# Patient Record
Sex: Male | Born: 1990 | Race: White | Hispanic: No | Marital: Single | Smoking: Current every day smoker
Health system: Southern US, Community
[De-identification: ages and names within clinical notes are randomized; demographics above are authoritative.]

## PROBLEM LIST (undated history)

## (undated) DIAGNOSIS — F1010 Alcohol abuse, uncomplicated: Secondary | ICD-10-CM

## (undated) DIAGNOSIS — R569 Unspecified convulsions: Secondary | ICD-10-CM

## (undated) DIAGNOSIS — F259 Schizoaffective disorder, unspecified: Secondary | ICD-10-CM

## (undated) DIAGNOSIS — K7400 Hepatic fibrosis, unspecified: Secondary | ICD-10-CM

## (undated) DIAGNOSIS — A4902 Methicillin resistant Staphylococcus aureus infection, unspecified site: Secondary | ICD-10-CM

## (undated) DIAGNOSIS — B182 Chronic viral hepatitis C: Secondary | ICD-10-CM

## (undated) DIAGNOSIS — F250 Schizoaffective disorder, bipolar type: Secondary | ICD-10-CM

## (undated) HISTORY — PX: ABDOMINAL SURGERY: SHX537

---
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
## 2012-11-24 ENCOUNTER — Encounter (HOSPITAL_BASED_OUTPATIENT_CLINIC_OR_DEPARTMENT_OTHER): Payer: Self-pay | Admitting: *Deleted

## 2012-11-24 ENCOUNTER — Emergency Department (HOSPITAL_BASED_OUTPATIENT_CLINIC_OR_DEPARTMENT_OTHER)
Admission: EM | Admit: 2012-11-24 | Discharge: 2012-11-24 | Disposition: A | Discharge status: Rehabilitation Facility | Payer: Self-pay | Attending: Emergency Medicine | Admitting: Emergency Medicine

## 2012-11-24 DIAGNOSIS — R112 Nausea with vomiting, unspecified: Secondary | ICD-10-CM | POA: Insufficient documentation

## 2012-11-24 DIAGNOSIS — M2550 Pain in unspecified joint: Secondary | ICD-10-CM | POA: Insufficient documentation

## 2012-11-24 DIAGNOSIS — R5381 Other malaise: Secondary | ICD-10-CM | POA: Insufficient documentation

## 2012-11-24 DIAGNOSIS — R509 Fever, unspecified: Secondary | ICD-10-CM | POA: Insufficient documentation

## 2012-11-24 DIAGNOSIS — F17200 Nicotine dependence, unspecified, uncomplicated: Secondary | ICD-10-CM | POA: Insufficient documentation

## 2012-11-24 HISTORY — DX: Hepatic fibrosis, unspecified: K74.00

## 2012-11-24 HISTORY — DX: Alcohol abuse, uncomplicated: F10.10

## 2012-11-24 HISTORY — DX: Hepatic fibrosis: K74.0

## 2012-11-24 HISTORY — DX: Methicillin resistant Staphylococcus aureus infection, unspecified site: A49.02

## 2012-11-24 LAB — URINALYSIS, ROUTINE W REFLEX MICROSCOPIC
Nitrite: NEGATIVE
Specific Gravity, Urine: 1.005 (ref 1.005–1.030)
Urobilinogen, UA: 0.2 mg/dL (ref 0.0–1.0)

## 2012-11-24 LAB — CBC WITH DIFFERENTIAL/PLATELET
Lymphocytes Relative: 32 % (ref 12–46)
Lymphs Abs: 1.6 10*3/uL (ref 0.7–4.0)
MCV: 91.9 fL (ref 78.0–100.0)
Neutrophils Relative %: 47 % (ref 43–77)
Platelets: 234 10*3/uL (ref 150–400)
RBC: 4.58 MIL/uL (ref 4.22–5.81)
WBC: 5 10*3/uL (ref 4.0–10.5)

## 2012-11-24 LAB — COMPREHENSIVE METABOLIC PANEL
Albumin: 3.9 g/dL (ref 3.5–5.2)
BUN: 11 mg/dL (ref 6–23)
Chloride: 100 mEq/L (ref 96–112)
Creatinine, Ser: 0.8 mg/dL (ref 0.50–1.35)
GFR calc non Af Amer: >90 mL/min (ref >90)
Total Bilirubin: 0.5 mg/dL (ref 0.3–1.2)

## 2012-11-24 LAB — PROTIME-INR
INR: 1.11 (ref 0.00–1.49)
Prothrombin Time: 14.1 seconds (ref 11.6–15.2)

## 2012-11-24 MED ORDER — PROCHLORPERAZINE MALEATE 10 MG PO TABS: 5.0000 mg | ORAL_TABLET | Freq: Four times a day (QID) | ORAL | Status: AC | PRN

## 2012-11-24 MED ORDER — ONDANSETRON 8 MG PO TBDP
8.0000 mg | ORAL_TABLET | Freq: Once | ORAL | Status: AC
  Administered 2012-11-24: 8 mg via ORAL
  Filled 2012-11-24: qty 1

## 2012-11-24 NOTE — ED Notes (Signed)
Pt amb to room 5 with quick steady gait in nad. Pt reports feeling "sick on my stomach" since 8/13. 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 Pt reports no appetite for several days. Has not seen his liver doctor "in over a year.Marland KitchenMarland Kitchen"

## 2012-11-24 NOTE — ED Provider Notes (Signed)
CSN: [id number]     Arrival date & time 11/24/12  0813 History     First MD Initiated Contact with Patient 11/24/12 [phone]     Chief Complaint  Patient presents with  . Nausea   (Consider location/radiation/quality/duration/timing/severity/associated sxs/prior Treatment) HPI Complains of nausea and vomiting for the past 11 days. Also complains of diffuse arthralgias for 11 days. Symptoms accompanied by subjective fever. No abdominal pain however feels "sick on my stomach". Last bowel movement yesterday, normal last time he vomited was yesterday. He drinks 1 gallon of water per day without vomiting. However, all foods and drinking water make him nauseated. Nothing makes symptoms better or worse. No treatment prior to coming here. No other associated symptoms No past medical history on file. No past surgical history on file. No family history on file. History  Substance Use Topics  . Smoking status: Not on file  . Smokeless tobacco: Not on file  . Alcohol Use: Not on file   social history positive smoker former abuser of alcohol, clean for 80 days. 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 Currently resides at [facility] past medical history hepatitis C, mononucleosis, seizure disorder.  Surgical history abdominal surgery for MRSA several years ago  Review of Systems  Constitutional: Positive for fatigue.  HENT: Negative.   Respiratory: Negative.   Cardiovascular: Negative.   Gastrointestinal: Positive for nausea and vomiting.  Musculoskeletal: Positive for arthralgias.  Skin: Negative.   Neurological: Negative.   Psychiatric/Behavioral: Negative.   All other systems reviewed and are negative.    Allergies  Review of patient's allergies indicates not on file.  Home Medications  No current outpatient prescriptions on file. BP 113/67  Pulse 90  Temp(Src) 98.2 F (36.8 C) (Oral)  Resp 20  Ht 5\' 11"  (1.803 m)  Wt 190 lb (86.183 kg)  BMI 26.51 kg/m2  SpO2 99% Physical  Exam  Nursing note and vitals reviewed. Constitutional: He appears well-developed and well-nourished. No distress.  Not acutely ill appearing  HENT:  Head: Normocephalic and atraumatic.  Eyes: Conjunctivae are normal. Pupils are equal, round, and reactive to light.  Neck: Neck supple. No tracheal deviation present. No thyromegaly present.  Cardiovascular: Normal rate and regular rhythm.   No murmur heard. Pulmonary/Chest: Effort normal and breath sounds normal.  Abdominal: Soft. Bowel sounds are normal. He exhibits no distension. There is no tenderness.  Genitourinary:  Normal circumcised male  Musculoskeletal: Normal range of motion. He exhibits no edema and no tenderness.  Neurological: He is alert. Coordination normal.  Skin: Skin is warm and dry. No rash noted.  Psychiatric: He has a normal mood and affect.    ED Course   Procedures (including critical care time)  Labs Reviewed - No data to display No results found. No diagnosis found. 9:30 AM nausea is resolved after treatment with Zofran. Results for orders placed during the hospital encounter of 11/24/12  COMPREHENSIVE METABOLIC PANEL      Result Value Range   Sodium 135  135 - 145 mEq/L   Potassium 3.9  3.5 - 5.1 mEq/L   Chloride 100  96 - 112 mEq/L   CO2 27  19 - 32 mEq/L   Glucose, Bld 82  70 - 99 mg/dL   BUN 11  6 - 23 mg/dL   Creatinine, Ser 1.19  0.50 - 1.35 mg/dL   Calcium 9.9  8.4 - 14.7 mg/dL   Total Protein 7.0  6.0 - 8.3 g/dL   Albumin 3.9  3.5 - 5.2 g/dL   AST 57 (*) 0 - 37 U/L   ALT 135 (*) 0 - 53 U/L   Alkaline Phosphatase 58  39 - 117 U/L   Total Bilirubin 0.5  0.3 - 1.2 mg/dL   GFR calc non Af Amer >90  >90 mL/min   GFR calc Af Amer >90  >90 mL/min  CBC WITH DIFFERENTIAL      Result Value Range   WBC 5.0  4.0 - 10.5 K/uL   RBC 4.58  4.22 - 5.81 MIL/uL   Hemoglobin 14.6  13.0 - 17.0 g/dL   HCT 16.1  09.6 - 04.5 %   MCV 91.9  78.0 - 100.0 fL   MCH 31.9  26.0 - 34.0 pg   MCHC 34.7  30.0 -  36.0 g/dL   RDW 40.9  81.1 - 91.4 %   Platelets 234  150 - 400 K/uL   Neutrophils Relative % 47  43 - 77 %   Neutro Abs 2.3  1.7 - 7.7 K/uL   Lymphocytes Relative 32  12 - 46 %   Lymphs Abs 1.6  0.7 - 4.0 K/uL   Monocytes Relative 15 (*) 3 - 12 %   Monocytes Absolute 0.7  0.1 - 1.0 K/uL   Eosinophils Relative 6 (*) 0 - 5 %   Eosinophils Absolute 0.3  0.0 - 0.7 K/uL   Basophils Relative 1  0 - 1 %   Basophils Absolute 0.0  0.0 - 0.1 K/uL  PROTIME-INR      Result Value Range   Prothrombin Time 14.1  11.6 - 15.2 seconds   INR 1.11  0.00 - 1.49  URINALYSIS, ROUTINE W REFLEX MICROSCOPIC      Result Value Range   Color, Urine YELLOW  YELLOW   APPearance CLEAR  CLEAR   Specific Gravity, Urine 1.005  1.005 - 1.030   pH 6.5  5.0 - 8.0   Glucose, UA NEGATIVE  NEGATIVE mg/dL   Hgb urine dipstick NEGATIVE  NEGATIVE   Bilirubin Urine NEGATIVE  NEGATIVE   Ketones, ur NEGATIVE  NEGATIVE mg/dL   Protein, ur NEGATIVE  NEGATIVE mg/dL   Urobilinogen, UA 0.2  0.0 - 1.0 mg/dL   Nitrite NEGATIVE  NEGATIVE   Leukocytes, UA NEGATIVE  NEGATIVE   No results found.  MDM  Patient reports that his joint aches started after his Neurontin was stopped by his psychiatrist. I explained that he should contact his psychiatrist regarding his symptoms and discuss Neurontin  with her, and that's benefit him in the past. I explained uncomfortable adjusting his psychiatric medications. He is in agreement. I discussed lab work with patient. He should also contact his primary care physician if not feeling better by next week Plan prescription Compazine, and Compazine as well as metabolized by the liver. Diagnosis #1 nausea and vomiting #2 arthralgias   Doug Sou, MD 11/24/12 [phone]

## 2013-01-12 ENCOUNTER — Emergency Department (HOSPITAL_BASED_OUTPATIENT_CLINIC_OR_DEPARTMENT_OTHER)
Admission: EM | Admit: 2013-01-12 | Discharge: 2013-01-12 | Disposition: A | Discharge status: Home / Self Care | Payer: Self-pay | Attending: Emergency Medicine | Admitting: Emergency Medicine

## 2013-01-12 ENCOUNTER — Encounter (HOSPITAL_BASED_OUTPATIENT_CLINIC_OR_DEPARTMENT_OTHER): Payer: Self-pay | Admitting: Emergency Medicine

## 2013-01-12 ENCOUNTER — Emergency Department (HOSPITAL_BASED_OUTPATIENT_CLINIC_OR_DEPARTMENT_OTHER): Payer: Self-pay

## 2013-01-12 DIAGNOSIS — Z79899 Other long term (current) drug therapy: Secondary | ICD-10-CM | POA: Insufficient documentation

## 2013-01-12 DIAGNOSIS — J069 Acute upper respiratory infection, unspecified: Secondary | ICD-10-CM | POA: Insufficient documentation

## 2013-01-12 DIAGNOSIS — F1021 Alcohol dependence, in remission: Secondary | ICD-10-CM | POA: Insufficient documentation

## 2013-01-12 DIAGNOSIS — Z8614 Personal history of Methicillin resistant Staphylococcus aureus infection: Secondary | ICD-10-CM | POA: Insufficient documentation

## 2013-01-12 DIAGNOSIS — Z8619 Personal history of other infectious and parasitic diseases: Secondary | ICD-10-CM | POA: Insufficient documentation

## 2013-01-12 DIAGNOSIS — G40909 Epilepsy, unspecified, not intractable, without status epilepticus: Secondary | ICD-10-CM | POA: Insufficient documentation

## 2013-01-12 DIAGNOSIS — H9209 Otalgia, unspecified ear: Secondary | ICD-10-CM | POA: Insufficient documentation

## 2013-01-12 DIAGNOSIS — F17200 Nicotine dependence, unspecified, uncomplicated: Secondary | ICD-10-CM | POA: Insufficient documentation

## 2013-01-12 HISTORY — DX: Unspecified convulsions: R56.9

## 2013-01-12 HISTORY — DX: Chronic viral hepatitis C: B18.2

## 2013-01-12 MED ORDER — HYDROCODONE-HOMATROPINE 5-1.5 MG/5ML PO SYRP
5.0000 mL | ORAL_SOLUTION | Freq: Once | ORAL | Status: DC
  Filled 2013-01-12: qty 5

## 2013-01-12 MED ORDER — DEXAMETHASONE SODIUM PHOSPHATE 10 MG/ML IJ SOLN
10.0000 mg | Freq: Once | INTRAMUSCULAR | Status: AC
  Administered 2013-01-12: 10 mg via INTRAMUSCULAR
  Filled 2013-01-12: qty 1

## 2013-01-12 MED ORDER — ALBUTEROL SULFATE HFA 108 (90 BASE) MCG/ACT IN AERS
2.0000 | INHALATION_SPRAY | Freq: Once | RESPIRATORY_TRACT | Status: AC
  Administered 2013-01-12: 2 via RESPIRATORY_TRACT
  Filled 2013-01-12: qty 6.7

## 2013-01-12 MED ORDER — HYDROCODONE-ACETAMINOPHEN 7.5-325 MG/15ML PO SOLN: 15.0000 mL | Freq: Three times a day (TID) | ORAL | Status: AC | PRN

## 2013-01-12 MED ORDER — HYDROCODONE-ACETAMINOPHEN 7.5-325 MG/15ML PO SOLN
10.0000 mL | Freq: Once | ORAL | Status: AC
  Administered 2013-01-12: 15 mL via ORAL
  Filled 2013-01-12: qty 15

## 2013-01-12 MED ORDER — ALBUTEROL SULFATE HFA 108 (90 BASE) MCG/ACT IN AERS: 2.0000 | INHALATION_SPRAY | RESPIRATORY_TRACT | Status: AC | PRN

## 2013-01-12 NOTE — ED Provider Notes (Signed)
CSN: [id number]     Arrival date & time 01/12/13  1124 History   First MD Initiated Contact with Patient 01/12/13 1235     Chief Complaint  Patient presents with  . Cough   (Consider location/radiation/quality/duration/timing/severity/associated sxs/prior Treatment) HPI Comments: Patient is a 22 year old male past medical history significant for ETOH abuse, Hepatitis C presenting to the emergency department for 2 weeks of a worsening nonproductive cough. Patient states his cough is worse at night or with lying down. States he has tried over-the-counter cough medications without relief. States he is having associated ear pain without discharge as well as a sore throat and chest tightness with coughing spells. He denies any fevers, rhinorrhea, nausea, vomiting, abdominal pain, headaches.    Past Medical History  Diagnosis Date  . Liver fibrosis   . Alcohol abuse   . MRSA infection   . Hep C w/o coma, chronic   . Seizures    Past Surgical History  Procedure Laterality Date  . Abdominal surgery     No family history on file. History  Substance Use Topics  . Smoking status: Current Every Day Smoker -- 0.50 packs/day    Types: Cigarettes  . Smokeless tobacco: Never Used  . Alcohol Use: No    Review of Systems  Constitutional: Negative for fever and chills.  HENT: Positive for ear pain and sore throat. Negative for rhinorrhea, sinus pressure, trouble swallowing and voice change.   Respiratory: Positive for cough and chest tightness. Negative for wheezing and stridor.   Cardiovascular: Negative for chest pain.  Gastrointestinal: Negative for nausea, vomiting and abdominal pain.  Neurological: Negative for headaches.  All other systems reviewed and are negative.    Allergies  Review of patient's allergies indicates no known allergies.  Home Medications   Current Outpatient Rx  Name  Route  Sig  Dispense  Refill  . albuterol (PROVENTIL HFA;VENTOLIN HFA) 108 (90 BASE) MCG/ACT  inhaler   Inhalation   Inhale 2 puffs into the lungs every 4 (four) hours as needed for wheezing or shortness of breath.   1 Inhaler   1   . HYDROcodone-acetaminophen (HYCET) 7.5-325 mg/15 ml solution   Oral   Take 15 mLs by mouth every 8 (eight) hours as needed for cough.   120 mL   0   . lamoTRIgine (LAMICTAL) 100 MG tablet   Oral   Take 100 mg by mouth daily.         . OXcarbazepine (TRILEPTAL) 150 MG tablet   Oral   Take 150 mg by mouth 2 (two) times daily.         . prochlorperazine (COMPAZINE) 10 MG tablet   Oral   Take 0.5 tablets (5 mg total) by mouth every 6 (six) hours as needed (nausea).   10 tablet   0    BP 110/67  Pulse 85  Temp(Src) 98.8 F (37.1 C) (Oral)  Resp 16  Ht 5\' 11"  (1.803 m)  Wt 185 lb (83.915 kg)  BMI 25.81 kg/m2  SpO2 100% Physical Exam  Constitutional: He is oriented to person, place, and time. He appears well-developed and well-nourished.  HENT:  Head: Normocephalic and atraumatic.  Right Ear: Tympanic membrane, external ear and ear canal normal.  Left Ear: Tympanic membrane, external ear and ear canal normal.  Nose: Nose normal.  Mouth/Throat: Uvula is midline, oropharynx is clear and moist and mucous membranes are normal. No oropharyngeal exudate.  Eyes: Conjunctivae are normal.  Neck: Normal range of motion.  Neck supple.  Cardiovascular: Normal rate, regular rhythm, normal heart sounds and intact distal pulses.   Pulmonary/Chest: Effort normal and breath sounds normal. He exhibits no tenderness.  Dry cough appreciated during examination.  Abdominal: Soft. Bowel sounds are normal. There is no tenderness.  Musculoskeletal: Normal range of motion.  Lymphadenopathy:    He has no cervical adenopathy.  Neurological: He is alert and oriented to person, place, and time.  Skin: Skin is warm and dry.    ED Course  Procedures (including critical care time) Labs Review Labs Reviewed - No data to display Imaging Review Dg Chest 2  View  01/12/2013   CLINICAL DATA:  Cough.  EXAM: CHEST  2 VIEW  COMPARISON:  None.  FINDINGS: The cardiomediastinal silhouette is normal. No focal infiltrate or edema. No pleural effusion or pneumothorax. No acute osseous abnormality.  IMPRESSION: No active cardiopulmonary disease.   Electronically Signed   By: Jerene Dilling M.D.   On: 01/12/2013 13:27    EKG Interpretation   None       MDM   1. URI (upper respiratory infection)     Afebrile, NAD, non-toxic appearing, AAOx4. Pt CXR negative for acute infiltrate. Patients symptoms are consistent with URI, likely viral etiology. Discussed that antibiotics are not indicated for viral infections. Pt will be discharged with symptomatic treatment. Return precautions discussed. Verbalizes understanding and is agreeable with plan. Pt is hemodynamically stable & in NAD prior to dc.      Jeannetta Ellis, PA-C 01/12/13 1636

## 2013-01-12 NOTE — ED Notes (Signed)
Patient states he has a one month history of coughing and cold symptoms.  States his cough has persisted and intermittently is productive.  States he feels sob when cough.  Denies fever.

## 2013-01-16 NOTE — ED Provider Notes (Signed)
History/physical exam/procedure(s) were performed by non-physician practitioner and as supervising physician I was immediately available for consultation/collaboration. I have reviewed all notes and am in agreement with care and plan.   Hilario Quarry, MD 01/16/13 1901

## 2015-04-25 ENCOUNTER — Emergency Department (HOSPITAL_BASED_OUTPATIENT_CLINIC_OR_DEPARTMENT_OTHER)

## 2015-04-25 ENCOUNTER — Encounter (HOSPITAL_BASED_OUTPATIENT_CLINIC_OR_DEPARTMENT_OTHER): Payer: Self-pay | Admitting: Emergency Medicine

## 2015-04-25 ENCOUNTER — Emergency Department (HOSPITAL_BASED_OUTPATIENT_CLINIC_OR_DEPARTMENT_OTHER)
Admission: EM | Admit: 2015-04-25 | Discharge: 2015-04-25 | Disposition: A | Discharge status: Home / Self Care | Attending: Emergency Medicine | Admitting: Emergency Medicine

## 2015-04-25 DIAGNOSIS — Y92143 Cell of prison as the place of occurrence of the external cause: Secondary | ICD-10-CM | POA: Diagnosis not present

## 2015-04-25 DIAGNOSIS — Z8619 Personal history of other infectious and parasitic diseases: Secondary | ICD-10-CM | POA: Diagnosis not present

## 2015-04-25 DIAGNOSIS — F10129 Alcohol abuse with intoxication, unspecified: Secondary | ICD-10-CM | POA: Diagnosis not present

## 2015-04-25 DIAGNOSIS — Y9389 Activity, other specified: Secondary | ICD-10-CM | POA: Diagnosis not present

## 2015-04-25 DIAGNOSIS — W228XXA Striking against or struck by other objects, initial encounter: Secondary | ICD-10-CM | POA: Insufficient documentation

## 2015-04-25 DIAGNOSIS — F17210 Nicotine dependence, cigarettes, uncomplicated: Secondary | ICD-10-CM | POA: Insufficient documentation

## 2015-04-25 DIAGNOSIS — S0990XA Unspecified injury of head, initial encounter: Secondary | ICD-10-CM | POA: Insufficient documentation

## 2015-04-25 DIAGNOSIS — Z8614 Personal history of Methicillin resistant Staphylococcus aureus infection: Secondary | ICD-10-CM | POA: Diagnosis not present

## 2015-04-25 DIAGNOSIS — F10920 Alcohol use, unspecified with intoxication, uncomplicated: Secondary | ICD-10-CM

## 2015-04-25 DIAGNOSIS — Z8719 Personal history of other diseases of the digestive system: Secondary | ICD-10-CM | POA: Diagnosis not present

## 2015-04-25 DIAGNOSIS — Y998 Other external cause status: Secondary | ICD-10-CM | POA: Diagnosis not present

## 2015-04-25 DIAGNOSIS — Z79899 Other long term (current) drug therapy: Secondary | ICD-10-CM | POA: Insufficient documentation

## 2015-04-25 DIAGNOSIS — S0101XA Laceration without foreign body of scalp, initial encounter: Secondary | ICD-10-CM | POA: Diagnosis not present

## 2015-04-25 HISTORY — DX: Schizoaffective disorder, unspecified: F25.9

## 2015-04-25 HISTORY — DX: Schizoaffective disorder, bipolar type: F25.0

## 2015-04-25 LAB — CBC
HEMATOCRIT: 41.6 % (ref 39.0–52.0)
Hemoglobin: 13.8 g/dL (ref 13.0–17.0)
MCH: 31.4 pg (ref 26.0–34.0)
MCHC: 33.2 g/dL (ref 30.0–36.0)
MCV: 94.5 fL (ref 78.0–100.0)
Platelets: 206 10*3/uL (ref 150–400)
RBC: 4.4 MIL/uL (ref 4.22–5.81)
RDW: 12.1 % (ref 11.5–15.5)
WBC: 6.8 10*3/uL (ref 4.0–10.5)

## 2015-04-25 LAB — BASIC METABOLIC PANEL
Anion gap: 9 (ref 5–15)
BUN: 15 mg/dL (ref 6–20)
CALCIUM: 8.5 mg/dL — AB (ref 8.9–10.3)
CO2: 25 mmol/L (ref 22–32)
CREATININE: 0.75 mg/dL (ref 0.61–1.24)
Chloride: 100 mmol/L — ABNORMAL LOW (ref 101–111)
GFR calc non Af Amer: >60 mL/min (ref >60)
GLUCOSE: 105 mg/dL — AB (ref 65–99)
Potassium: 3.6 mmol/L (ref 3.5–5.1)
Sodium: 134 mmol/L — ABNORMAL LOW (ref 135–145)

## 2015-04-25 LAB — VALPROIC ACID LEVEL: Valproic Acid Lvl: 28 ug/mL — ABNORMAL LOW (ref 50.0–100.0)

## 2015-04-25 LAB — ETHANOL: ALCOHOL ETHYL (B): 263 mg/dL — AB (ref <5)

## 2015-04-25 MED ORDER — IBUPROFEN 800 MG PO TABS: 800.0000 mg | ORAL_TABLET | Freq: Three times a day (TID) | ORAL | Status: AC

## 2015-04-25 MED ORDER — IBUPROFEN 800 MG PO TABS
800.0000 mg | ORAL_TABLET | Freq: Once | ORAL | Status: AC
  Administered 2015-04-25: 800 mg via ORAL
  Filled 2015-04-25: qty 1

## 2015-04-25 MED ORDER — IBUPROFEN 800 MG PO TABS: 800.0000 mg | ORAL_TABLET | Freq: Once | ORAL | Status: DC
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
## 2015-04-25 NOTE — Discharge Instructions (Signed)
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
